# Patient Record
Sex: Female | Born: 1985 | Race: Black or African American | Hispanic: No | Marital: Single
Health system: Southern US, Community
[De-identification: ages and names within clinical notes are randomized; demographics above are authoritative.]

---

## 2005-02-14 ENCOUNTER — Emergency Department (HOSPITAL_COMMUNITY): Admission: EM | Admit: 2005-02-14 | Discharge: 2005-02-14 | Payer: Self-pay | Admitting: Emergency Medicine

## 2006-01-17 ENCOUNTER — Emergency Department: Payer: Self-pay | Admitting: Emergency Medicine

## 2006-08-01 ENCOUNTER — Observation Stay: Payer: Self-pay | Admitting: Obstetrics and Gynecology

## 2006-08-05 ENCOUNTER — Observation Stay: Payer: Self-pay

## 2006-08-09 ENCOUNTER — Observation Stay: Payer: Self-pay | Admitting: Obstetrics and Gynecology

## 2006-08-12 ENCOUNTER — Inpatient Hospital Stay: Payer: Self-pay | Admitting: Obstetrics and Gynecology

## 2007-01-29 ENCOUNTER — Emergency Department: Payer: Self-pay | Admitting: General Practice

## 2007-09-26 ENCOUNTER — Emergency Department: Payer: Self-pay | Admitting: Emergency Medicine

## 2008-09-22 IMAGING — CR DG CHEST 2V
1 series · 2 of 2 positions shown · non-contrast
Comparison: none

REASON FOR EXAM: pleuritic chest pain
COMMENTS:

PROCEDURE:     DXR - DXR CHEST PA (OR AP) AND LATERAL  - September 26, 2007  [DATE]
RESULT:     The lung fields are clear. The heart, mediastinal and osseous
structures show no significant abnormalities.

[Series 1: view not recorded · 0.17mm/px · 2 of 2 slices shown]
[im 1/2]
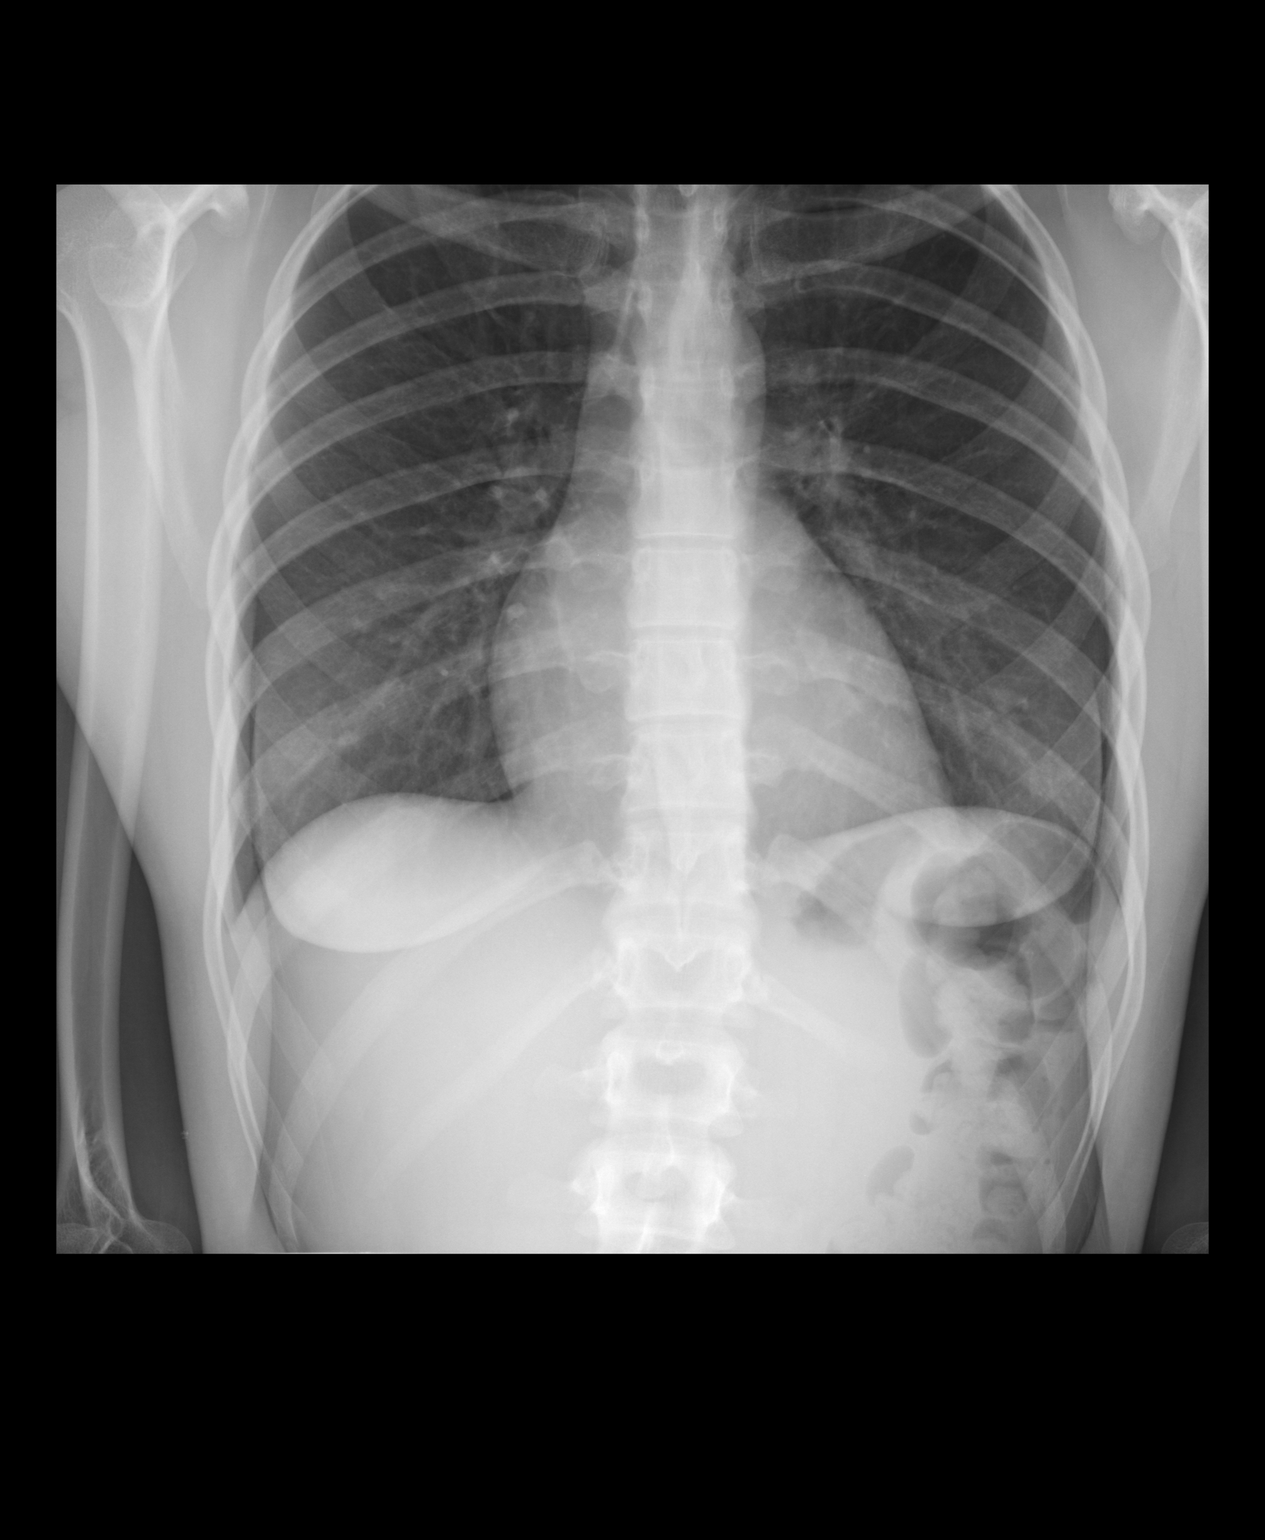
[im 2/2]
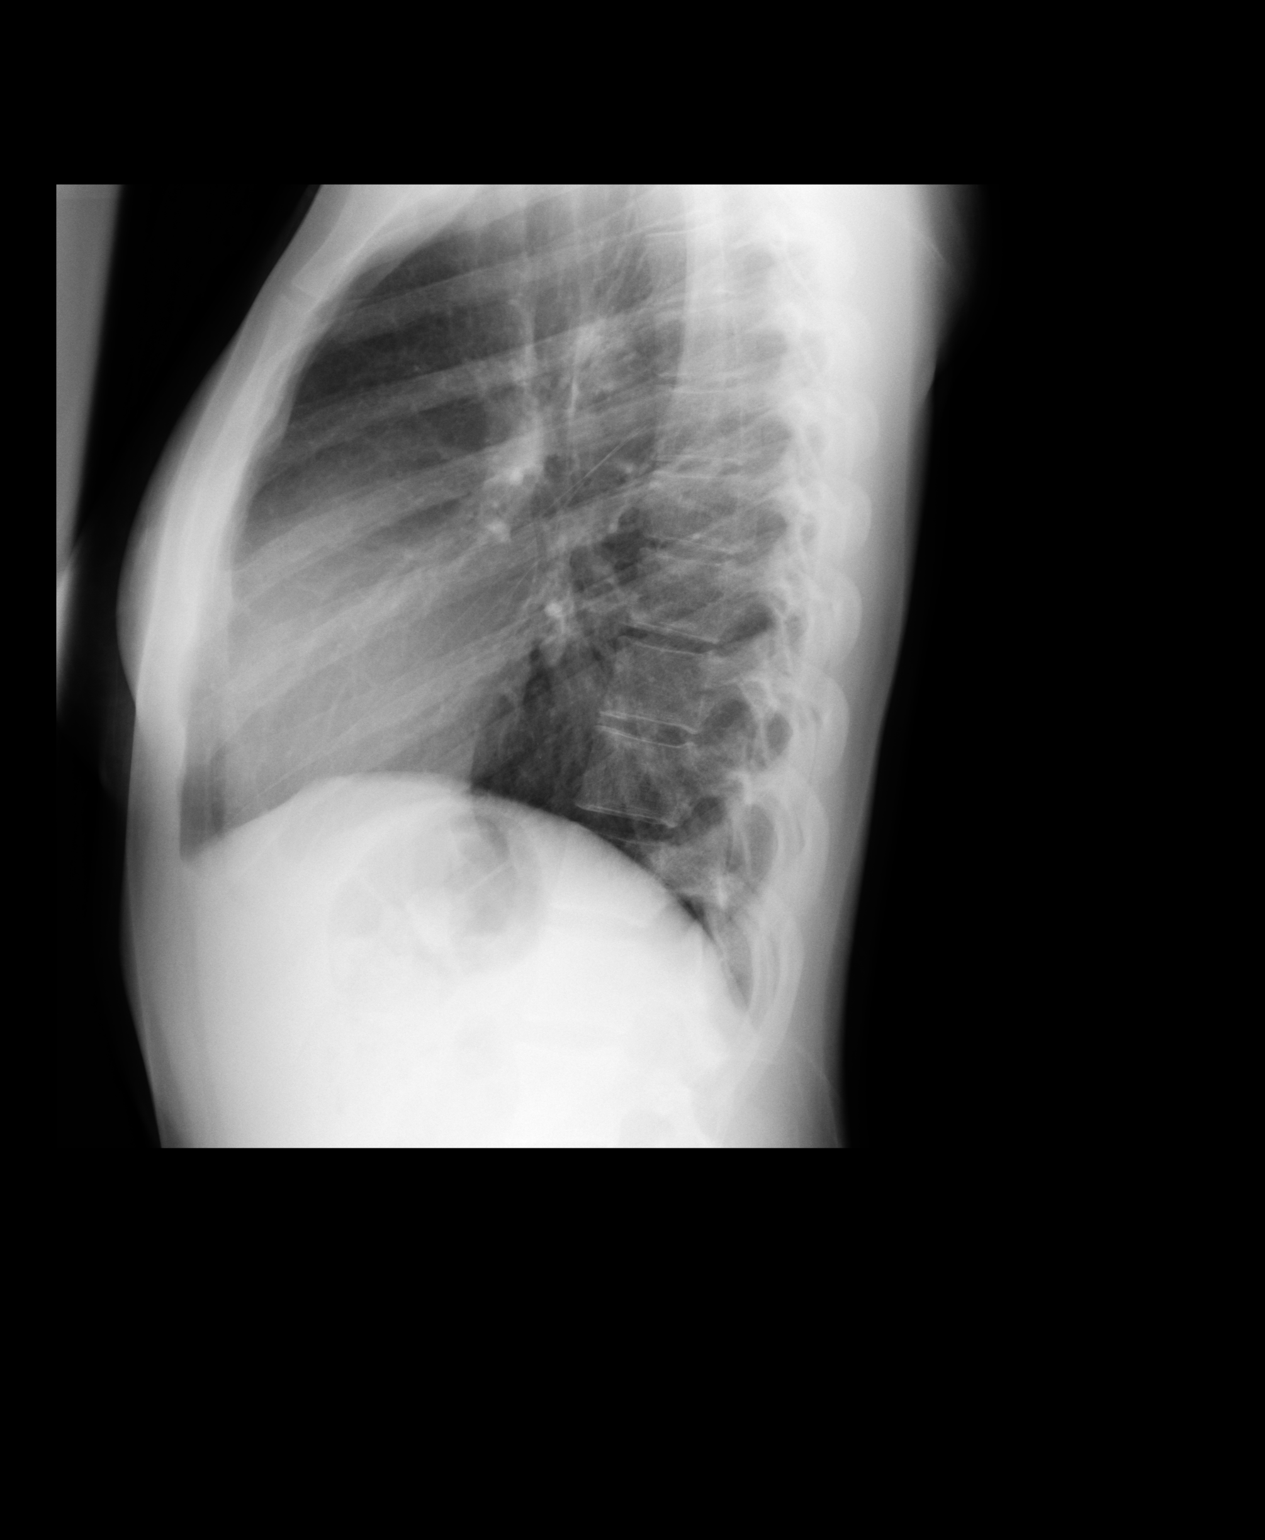

[2 of 2 positions shown; findings below may reference images not displayed]

IMPRESSION: 1.     No significant abnormalities are noted.

## 2015-11-19 LAB — HM PAP SMEAR

## 2015-12-07 ENCOUNTER — Emergency Department: Payer: Medicaid Other

## 2015-12-07 ENCOUNTER — Emergency Department
Admission: EM | Admit: 2015-12-07 | Discharge: 2015-12-07 | Disposition: A | Discharge status: Home / Self Care | Payer: Medicaid Other | Attending: Emergency Medicine | Admitting: Emergency Medicine

## 2015-12-07 ENCOUNTER — Encounter: Payer: Self-pay | Admitting: Emergency Medicine

## 2015-12-07 DIAGNOSIS — S93601A Unspecified sprain of right foot, initial encounter: Secondary | ICD-10-CM | POA: Insufficient documentation

## 2015-12-07 DIAGNOSIS — Y9389 Activity, other specified: Secondary | ICD-10-CM | POA: Insufficient documentation

## 2015-12-07 DIAGNOSIS — Y998 Other external cause status: Secondary | ICD-10-CM | POA: Diagnosis not present

## 2015-12-07 DIAGNOSIS — Y9289 Other specified places as the place of occurrence of the external cause: Secondary | ICD-10-CM | POA: Diagnosis not present

## 2015-12-07 DIAGNOSIS — S9031XA Contusion of right foot, initial encounter: Secondary | ICD-10-CM | POA: Insufficient documentation

## 2015-12-07 DIAGNOSIS — S99921A Unspecified injury of right foot, initial encounter: Secondary | ICD-10-CM | POA: Diagnosis present

## 2015-12-07 NOTE — ED Provider Notes (Signed)
[facility] Emergency Department Provider Note  ____________________________________________  Time seen: Approximately 2:49 PM  I have reviewed the triage vital signs and the nursing notes.   HISTORY  Chief Complaint Foot Injury  HPI Alicia Schultz is a 30 y.o. female presents with a complaint of right foot pain. Patient reports that last night she was wearing high heels and states that as she went to step out of her sister's car, her shoe became tangled on a purse strap causing her to roll her foot and ankle. States that she did fall to the ground but denies any other injury. Patient reports that she has had right foot pain since the injury.  States current right foot pain is 5 out of 10 aching. States pain is worse with actively ambulating or with direct palpation. Reports has continued to walk on her tip toes.  Denies head injury or loss of consciousness. Denies neck or back pain or injury. Denies other extremity pain or injury. Patient reports that she felt well just prior and post fall, except for foot pain. States the only reason she fell was because her heel became tangled with the purse strap. Patient states that she does not need any pain medication but states that she just wanted to make sure that her foot was not broken.  Patient's last menstrual period was 11/30/2015 (approximate). denies pregnancy.   History reviewed. No pertinent past medical history.  There are no active problems to display for this patient.   No past surgical history on file.  No current outpatient prescriptions on file.  Allergies Review of patient's allergies indicates not on file.  History reviewed. No pertinent family history.  Social History Social History  Substance Use Topics  . Smoking status: None  . Smokeless tobacco: None  . Alcohol Use: None    Review of Systems Constitutional: No fever/chills Eyes: No visual changes. ENT: No sore  throat. Cardiovascular: Denies chest pain. Respiratory: Denies shortness of breath. Gastrointestinal: No abdominal pain.  No nausea, no vomiting.  No diarrhea.  No constipation. Genitourinary: Negative for dysuria. Musculoskeletal: Negative for back pain. Right foot pain. Skin: Negative for rash. Neurological: Negative for headaches, focal weakness or numbness.  10-point ROS otherwise negative.  ____________________________________________   PHYSICAL EXAM:  VITAL SIGNS: ED Triage Vitals  Enc Vitals Group     BP -- 123/76     Pulse -- 82     Resp 12/07/15 1325 17     Temp 12/07/15 1325 99.1 F (37.3 C)     Temp Source 12/07/15 1325 Oral     SpO2 -- 99% room air     Weight --      Height --      Head Cir --      Peak Flow --      Pain Score 12/07/15 1329 8     Pain Loc --      Pain Edu? --      Excl. in GC? --    Constitutional: Alert and oriented. Well appearing and in no acute distress. Eyes: Conjunctivae are normal. PERRL. EOMI. Head: Atraumatic.nontender. No swelling. No erythema.   Nose: No nasal congestion.  Mouth/Throat: Mucous membranes are moist.   Neck: No stridor.  No cervical spine tenderness to palpation. Hematological/Lymphatic/Immunilogical: No cervical lymphadenopathy. Cardiovascular: Normal rate, regular rhythm. Grossly normal heart sounds.  Good peripheral circulation. Respiratory: Normal respiratory effort.  No retractions. Lungs CTAB. No wheezes, rales or rhonchi. Good air movement.  Gastrointestinal:  Soft and nontender. No distention. Normal Bowel sounds. No CVA tenderness. Musculoskeletal: No lower or upper extremity tenderness nor edema.  Bilateral pedal pulses equal and easily palpated. No cervical, thoracic or lumbar tenderness to palpation.  Except: Right dorsal lateral mid foot mild to moderate tender to palpation, mild ecchymosis, mild swelling, full range of motion, mild pain with plantar flexion and dorsiflexion as well as ankle rotation.  Capillary refill less than 2 seconds to all right foot distal toes. Right foot sensation intact. No motor or tendon deficit to right foot. Right lower extremity otherwise nontender. Neurologic:  Normal speech and language. No gross focal neurologic deficits are appreciated. No gait instability. Skin:  Skin is warm, dry and intact. No rash noted. Psychiatric: Mood and affect are normal. Speech and behavior are normal.  ____________________________________________   LABS (all labs ordered are listed, but only abnormal results are displayed)  Labs Reviewed - No data to display  RADIOLOGY  EXAM: RIGHT FOOT COMPLETE - 3+ VIEW  COMPARISON: None.  FINDINGS: No fracture or dislocation is seen.  The joint spaces are preserved.  Visualized soft tissues are within normal limits.  IMPRESSION: No fracture or dislocation is seen.   Electronically Signed By: Charline Bills M.D. On: 12/07/2015 14:57      I, Renford Dills, personally viewed and evaluated these images (plain radiographs) as part of my medical decision making, as well as reviewing the written report by the radiologist.  ____________________________________________   PROCEDURES  Procedure(s) performed:  Right foot Ace wrap and crutches given by RN. Neurovascular intact post application.  INITIAL IMPRESSION / ASSESSMENT AND PLAN / ED COURSE  Pertinent labs & imaging results that were available during my care of the patient were reviewed by me and considered in my medical decision making (see chart for details).  Very well-appearing patient. No acute distress. Presents for the complaint of right foot pain post mechanical injury last night. Denies head injury or loss consciousness.No focal neurological deficits. Denies other pain or injury. Right dorsal lateral mid foot mild to moderate tenderness to palpation with mild ecchymosis, mild swelling. Skin intact. Full range of motion. Denies other pain. Will  evaluate by x-ray.  Right foot x-ray no fracture or dislocation seen per radiologist. Suspect strain injury. Will treat with ice, elevation, rest. Crutches and Ace wrap given. Information for orthopedic given. Follow-up with orthopedic in one week as needed for continued pain. Patient denies need for prescription medication and states that she will take over-the-counter Tylenol as needed for pain.  Discussed follow up with Primary care physician this week. Discussed follow up and return parameters including no resolution or any worsening concerns. Patient verbalized understanding and agreed to plan.   ____________________________________________   FINAL CLINICAL IMPRESSION(S) / ED DIAGNOSES  Final diagnoses:  Right foot sprain, initial encounter      Renford Dills, NP 12/07/15 1600  Sharman Cheek, MD 12/08/15 2328

## 2015-12-07 NOTE — Discharge Instructions (Signed)
Apply ice and elevate. Use crutches and ace wrap for 2-3 days and gradually apply weight as tolerated. Continue ace wrap and crutches as needed for continued pain.   Follow up with your primary care physician this week as needed.  Follow up with orthopedic as needed for continued pain.  Return to ER for new or worsening concerns.    Foot Sprain A foot sprain is an injury to one of the strong bands of tissue (ligaments) that connect and support the many bones in your feet. The ligament can be stretched too much or it can tear. A tear can be either partial or complete. The severity of the sprain depends on how much of the ligament was damaged or torn. CAUSES A foot sprain is usually caused by suddenly twisting or pivoting your foot. RISK FACTORS This injury is more likely to occur in people who:  Play a sport, such as basketball or football.  Exercise or play a sport without warming up.  Start a new workout or sport.  Suddenly increase how long or hard they exercise or play a sport. SYMPTOMS Symptoms of this condition start soon after an injury and include:  Pain, especially in the arch of the foot.  Bruising.  Swelling.  Inability to walk or use the foot to support body weight. DIAGNOSIS This condition is diagnosed with a medical history and physical exam. You may also have imaging tests, such as:  X-rays to make sure there are no broken bones (fractures).  MRI to see if the ligament has torn. TREATMENT Treatment varies depending on the severity of your sprain. Mild sprains can be treated with rest, ice, compression, and elevation (RICE). If your ligament is overstretched or partially torn, treatment usually involves keeping your foot in a fixed position (immobilization) for a period of time. To help you do this, your health care provider will apply a bandage, splint, or walking boot to keep your foot from moving until it heals. You may also be advised to use crutches or a  scooter for a few weeks to avoid bearing weight on your foot while it is healing. If your ligament is fully torn, you may need surgery to reconnect the ligament to the bone. After surgery, a cast or splint will be applied and will need to stay on your foot while it heals. Your health care provider may also suggest exercises or physical therapy to strengthen your foot. HOME CARE INSTRUCTIONS If You Have a Bandage, Splint, or Walking Boot:  Wear it as directed by your health care provider. Remove it only as directed by your health care provider.  Loosen the bandage, splint, or walking boot if your toes become numb and tingle, or if they turn cold and blue. Bathing  If your health care provider approves bathing and showering, cover the bandage or splint with a watertight plastic bag to protect it from water. Do not let the bandage or splint get wet. Managing Pain, Stiffness, and Swelling   If directed, apply ice to the injured area:  Put ice in a plastic bag.  Place a towel between your skin and the bag.  Leave the ice on for 20 minutes, 2-3 times per day.  Move your toes often to avoid stiffness and to lessen swelling.  Raise (elevate) the injured area above the level of your heart while you are sitting or lying down. Driving  Do not drive or operate heavy machinery while taking pain medicine.  Do not drive while wearing  a bandage, splint, or walking boot on a foot that you use for driving. Activity  Rest as directed by your health care provider.  Do not use the injured foot to support your body weight until your health care provider says that you can. Use crutches or other supportive devices as directed by your health care provider.  Ask your health care provider what activities are safe for you. Gradually increase how much and how far you walk until your health care provider says it is safe to return to full activity.  Do any exercise or physical therapy as directed by your  health care provider. General Instructions  If a splint was applied, do not put pressure on any part of it until it is fully hardened. This may take several hours.  Take medicines only as directed by your health care provider. These include over-the-counter medicines and prescription medicines.  Keep all follow-up visits as directed by your health care provider. This is important.  When you can walk without pain, wear supportive shoes that have stiff soles. Do not wear flip-flops, and do not walk barefoot. SEEK MEDICAL CARE IF:  Your pain is not controlled with medicine.  Your bruising or swelling gets worse or does not get better with treatment.  Your splint or walking boot is damaged. SEEK IMMEDIATE MEDICAL CARE IF:  Your foot is numb or blue.  Your foot feels colder than normal.   This information is not intended to replace advice given to you by your health care provider. Make sure you discuss any questions you have with your health care provider.   Document Released: 03/05/2002 Document Revised: 01/28/2015 Document Reviewed: 07/17/2014 Elsevier Interactive Patient Education [employer]

## 2015-12-07 NOTE — ED Notes (Signed)
Pt twisted R ankle last night getting out of her car, swelling noted to R side of ankle and foot.

## 2016-12-03 IMAGING — DX DG FOOT COMPLETE 3+V*R*
3 series · 3 of 3 positions shown · non-contrast
Comparison: None.

CLINICAL DATA: Foot pain/injury

EXAM:
RIGHT FOOT COMPLETE - 3+ VIEW

[foot ap]
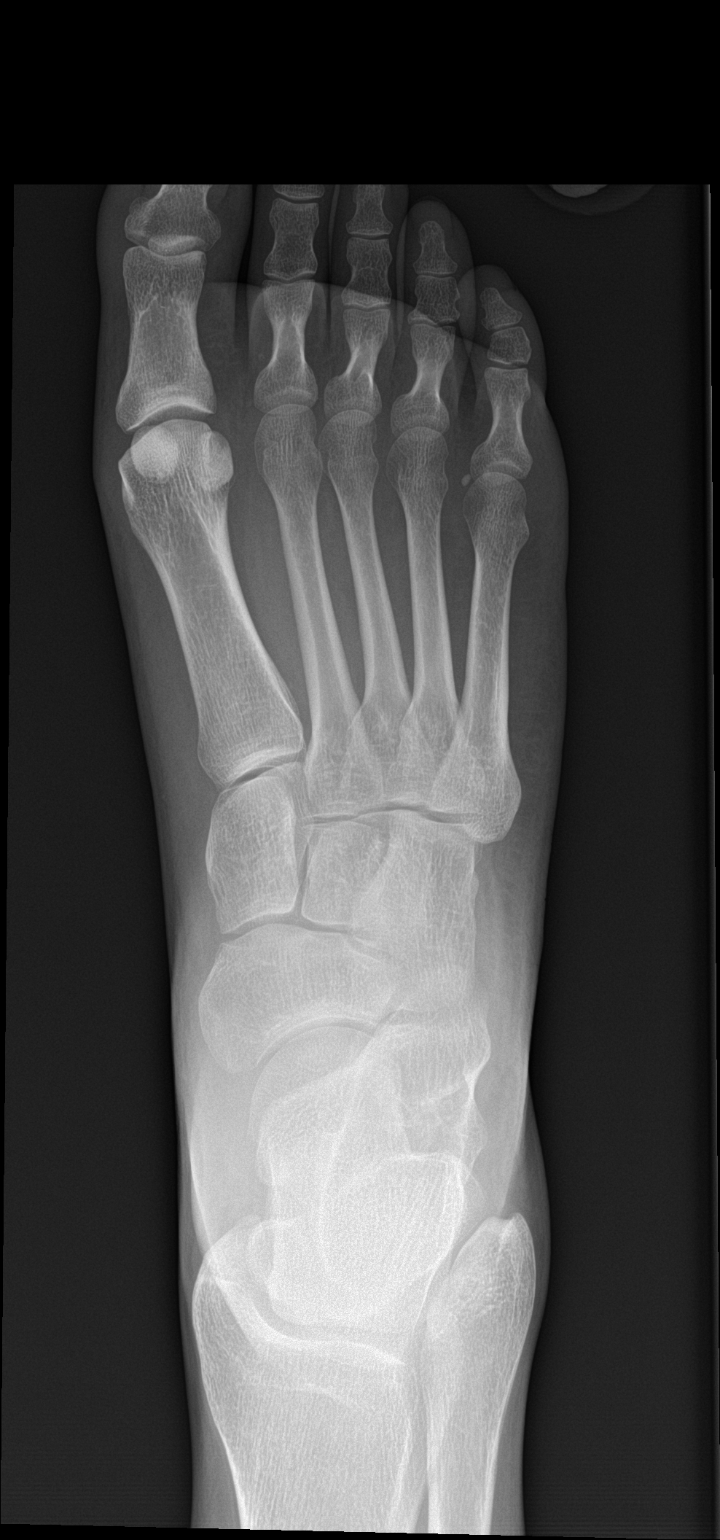

[foot obl]
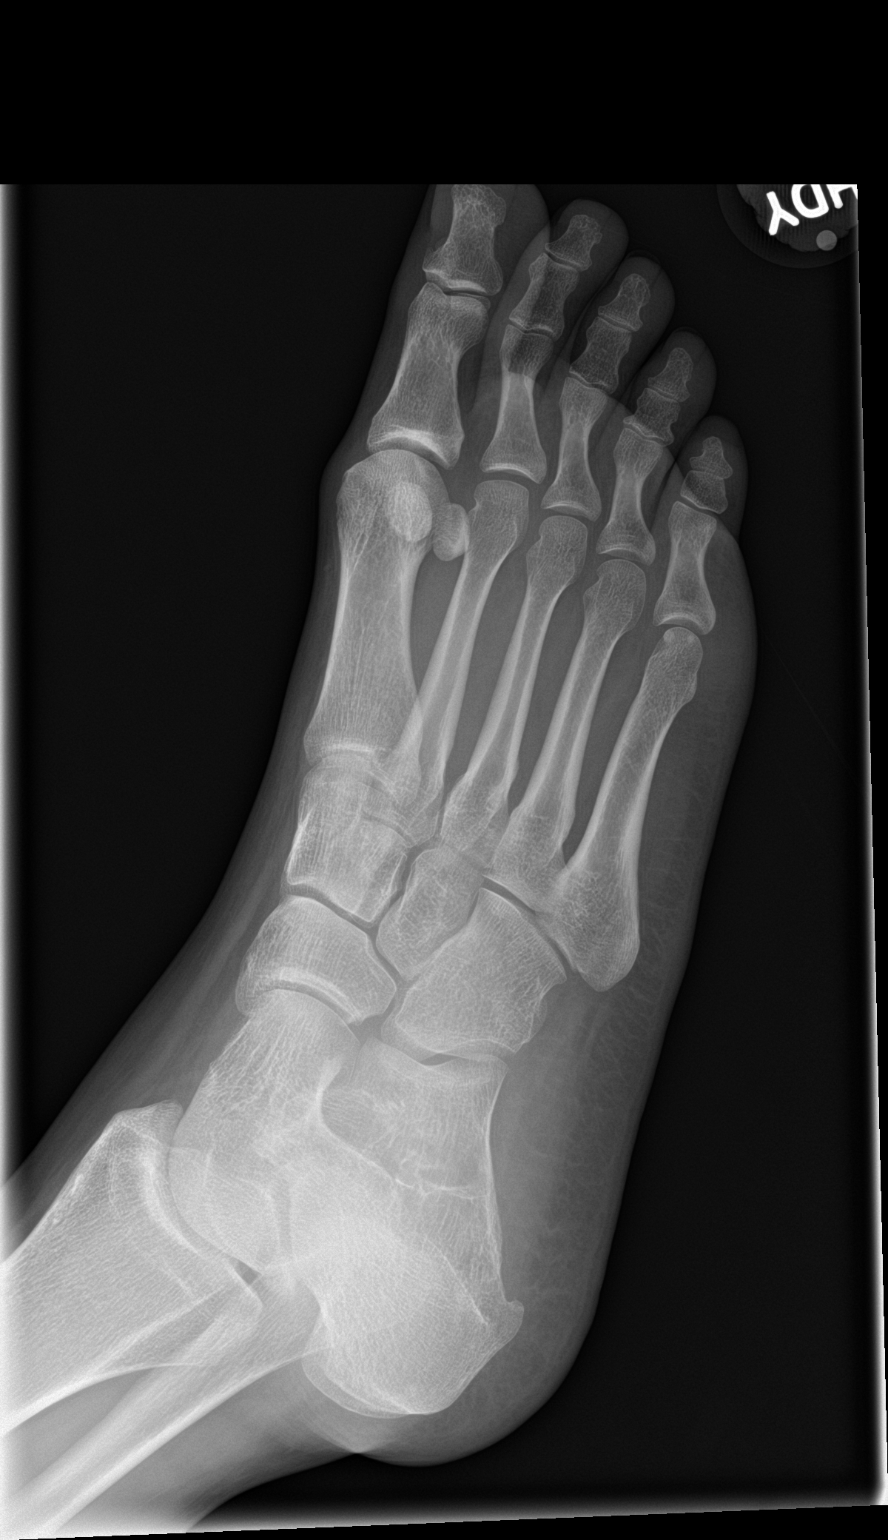

[foot lat]
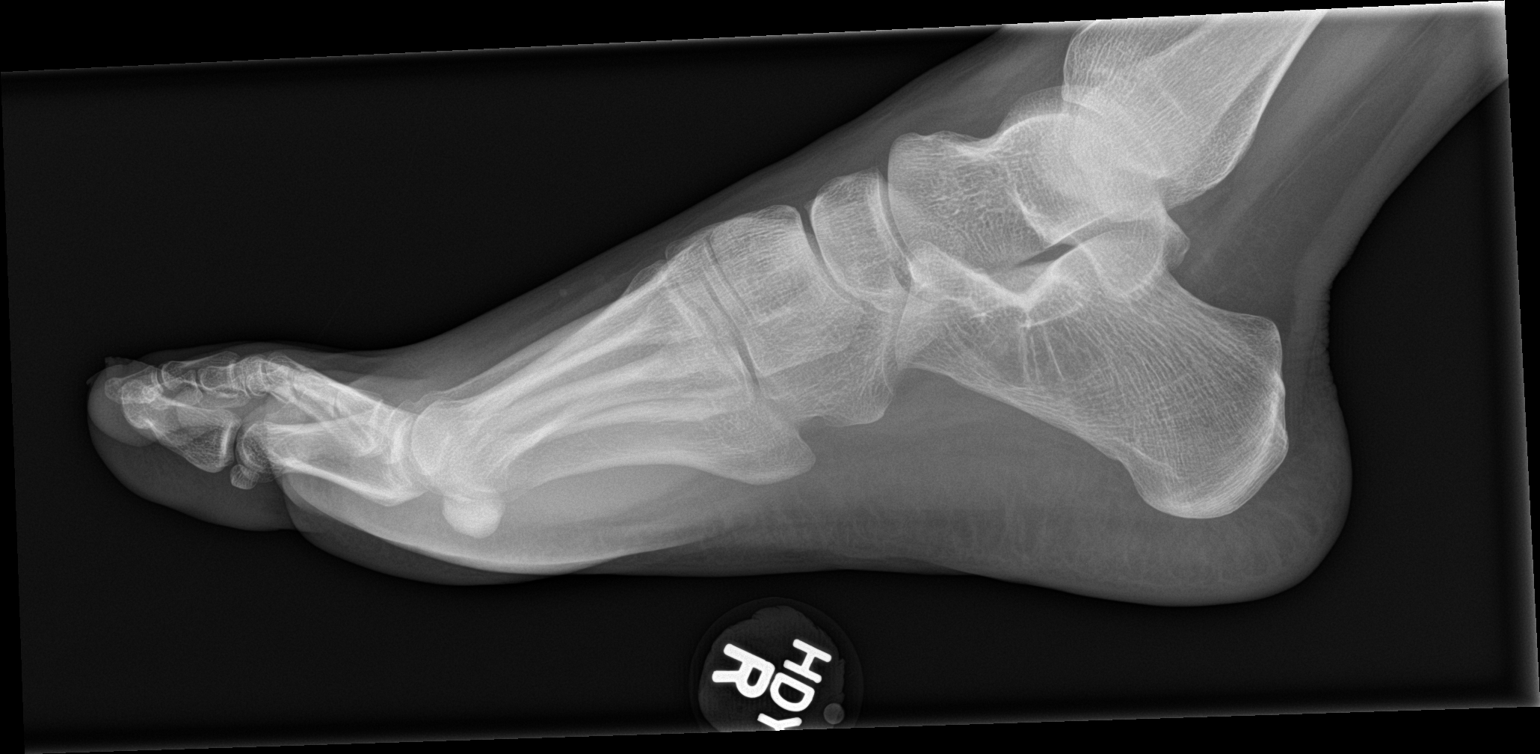

[3 of 3 positions shown; findings below may reference images not displayed]

FINDINGS: No fracture or dislocation is seen.

The joint spaces are preserved.

Visualized soft tissues are within normal limits.
IMPRESSION: No fracture or dislocation is seen.

## 2017-04-01 ENCOUNTER — Encounter: Payer: Self-pay | Admitting: Advanced Practice Midwife

## 2017-04-05 ENCOUNTER — Encounter: Payer: Self-pay | Admitting: Advanced Practice Midwife

## 2017-04-06 NOTE — Progress Notes (Signed)
This encounter was created in error - please disregard.

## 2019-04-26 ENCOUNTER — Encounter: Payer: Self-pay | Admitting: Advanced Practice Midwife
